# Patient Record
Sex: Female | Born: 1990 | Race: Black or African American | Hispanic: No | Marital: Single | Smoking: Never smoker
Health system: Southern US, Community
[De-identification: ages and names within clinical notes are randomized; demographics above are authoritative.]

---

## 2017-09-04 ENCOUNTER — Other Ambulatory Visit: Payer: Self-pay

## 2017-09-04 ENCOUNTER — Emergency Department (HOSPITAL_BASED_OUTPATIENT_CLINIC_OR_DEPARTMENT_OTHER): Payer: 59

## 2017-09-04 ENCOUNTER — Encounter (HOSPITAL_BASED_OUTPATIENT_CLINIC_OR_DEPARTMENT_OTHER): Payer: Self-pay | Admitting: *Deleted

## 2017-09-04 ENCOUNTER — Emergency Department (HOSPITAL_BASED_OUTPATIENT_CLINIC_OR_DEPARTMENT_OTHER)
Admission: EM | Admit: 2017-09-04 | Discharge: 2017-09-04 | Disposition: A | Discharge status: Home / Self Care | Payer: 59 | Attending: Emergency Medicine | Admitting: Emergency Medicine

## 2017-09-04 DIAGNOSIS — K529 Noninfective gastroenteritis and colitis, unspecified: Secondary | ICD-10-CM | POA: Insufficient documentation

## 2017-09-04 DIAGNOSIS — R1011 Right upper quadrant pain: Secondary | ICD-10-CM | POA: Diagnosis not present

## 2017-09-04 DIAGNOSIS — R112 Nausea with vomiting, unspecified: Secondary | ICD-10-CM | POA: Diagnosis present

## 2017-09-04 LAB — COMPREHENSIVE METABOLIC PANEL
ALT: 17 U/L (ref 14–54)
AST: 23 U/L (ref 15–41)
Albumin: 4 g/dL (ref 3.5–5.0)
Alkaline Phosphatase: 86 U/L (ref 38–126)
Anion gap: 9 (ref 5–15)
BUN: 18 mg/dL (ref 6–20)
CO2: 23 mmol/L (ref 22–32)
Calcium: 8.6 mg/dL — ABNORMAL LOW (ref 8.9–10.3)
Chloride: 104 mmol/L (ref 101–111)
Creatinine, Ser: 1.03 mg/dL — ABNORMAL HIGH (ref 0.44–1.00)
GFR calc Af Amer: >60 mL/min (ref >60)
GFR calc non Af Amer: >60 mL/min (ref >60)
Glucose, Bld: 110 mg/dL — ABNORMAL HIGH (ref 65–99)
Potassium: 4.1 mmol/L (ref 3.5–5.1)
Sodium: 136 mmol/L (ref 135–145)
Total Bilirubin: 1.2 mg/dL (ref 0.3–1.2)
Total Protein: 7.8 g/dL (ref 6.5–8.1)

## 2017-09-04 LAB — URINALYSIS, ROUTINE W REFLEX MICROSCOPIC
Bilirubin Urine: NEGATIVE
Glucose, UA: NEGATIVE mg/dL
Hgb urine dipstick: NEGATIVE
Ketones, ur: 15 mg/dL — AB
Leukocytes, UA: NEGATIVE
Nitrite: NEGATIVE
Protein, ur: 30 mg/dL — AB
Specific Gravity, Urine: >1.03 — ABNORMAL HIGH (ref 1.005–1.030)
pH: 6 (ref 5.0–8.0)

## 2017-09-04 LAB — URINALYSIS, MICROSCOPIC (REFLEX)

## 2017-09-04 LAB — CBC
HCT: 41.5 % (ref 36.0–46.0)
Hemoglobin: 13.6 g/dL (ref 12.0–15.0)
MCH: 27.8 pg (ref 26.0–34.0)
MCHC: 32.8 g/dL (ref 30.0–36.0)
MCV: 84.9 fL (ref 78.0–100.0)
Platelets: 219 10*3/uL (ref 150–400)
RBC: 4.89 MIL/uL (ref 3.87–5.11)
RDW: 13.7 % (ref 11.5–15.5)
WBC: 6.5 10*3/uL (ref 4.0–10.5)

## 2017-09-04 LAB — LIPASE, BLOOD: Lipase: 27 U/L (ref 11–51)

## 2017-09-04 LAB — PREGNANCY, URINE: Preg Test, Ur: NEGATIVE

## 2017-09-04 MED ORDER — ONDANSETRON HCL 4 MG/2ML IJ SOLN
4.0000 mg | Freq: Once | INTRAMUSCULAR | Status: AC
  Administered 2017-09-04: 4 mg via INTRAVENOUS
  Filled 2017-09-04: qty 2

## 2017-09-04 MED ORDER — ONDANSETRON 4 MG PO TBDP: 4.0000 mg | ORAL_TABLET | Freq: Three times a day (TID) | ORAL | 0 refills | Status: AC | PRN

## 2017-09-04 MED ORDER — ACETAMINOPHEN 500 MG PO TABS
1000.0000 mg | ORAL_TABLET | Freq: Once | ORAL | Status: AC
  Administered 2017-09-04: 1000 mg via ORAL
  Filled 2017-09-04: qty 2

## 2017-09-04 MED ORDER — SODIUM CHLORIDE 0.9 % IV BOLUS (SEPSIS)
1000.0000 mL | Freq: Once | INTRAVENOUS | Status: AC
  Administered 2017-09-04: 1000 mL via INTRAVENOUS

## 2017-09-04 MED FILL — ONDANSETRON ODT 4 MG TABLET: 4 | 6 days supply | Qty: 20 | Fill #0

## 2017-09-04 NOTE — Discharge Instructions (Addendum)
Your blood work and urine test were reassuring today.    I have written you a prescription for nausea medicine called Zofran.  Please take this as needed for nausea and vomiting.    The stomach virus is contagious so please do not share drinks with family members or friends.  Please also stick to a bland diet with avoidance of greasy and spicy foods.  Your ultrasound showed a small mass on the liver (1.5cm.)  I have printed this result in your discharge paperwork.  Please inform your primary care doctor to follow-up on this result.  Return to the emergency department if you have vomiting that will not stop, worsening stomach pain or have any new or worsening symptoms.

## 2017-09-04 NOTE — ED Notes (Signed)
NAD at this time. Pt is stable and going home.  

## 2017-09-04 NOTE — ED Provider Notes (Signed)
[facility] EMERGENCY DEPARTMENT Provider Note   CSN: [id number] Arrival date & time: 09/04/17  1150     History   Chief Complaint Chief Complaint  Patient presents with  . Abdominal Pain    HPI Cheryl Parsons is a 26 y.o. female.  HPI  Cheryl Parsons is a 26 year old female with no significant past medical history who presents to the emergency department for evaluation of nausea, vomiting, chills and abdominal pain.  Patient states that her symptoms abruptly woke her from sleep this morning.  She reports that she also has several family members with similar symptoms.  Went to urgent care where she had a negative flu test, negative strep test and negative urine.  Due to the fact that she had significant right upper quadrant pain on exam she was told to come to the emergency department.  Patient states that she has 6/10 severity sharp right upper quadrant pain which is constant and ongoing since the morning.  Her pain is worsened with sitting up or vomiting.  It does not radiate.  She denies previous abdominal surgeries.  She denies hematemesis, hematochezia, melena, dysuria, urinary frequency, vaginal discharge, pelvic pain, chest pain, shortness of breath.   History reviewed. No pertinent past medical history.  There are no active problems to display for this patient.   History reviewed. No pertinent surgical history.  OB History    No data available       Home Medications    Prior to Admission medications   Not on File    Family History No family history on file.  Social History Social History   Tobacco Use  . Smoking status: Never Smoker  . Smokeless tobacco: Never Used  Substance Use Topics  . Alcohol use: No    Frequency: Never  . Drug use: No     Allergies   Patient has no known allergies.   Review of Systems Review of Systems  Constitutional: Positive for appetite change (low appetite) and chills. Negative for fever.  Eyes:  Negative for visual disturbance.  Respiratory: Negative for shortness of breath.   Cardiovascular: Negative for chest pain.  Gastrointestinal: Positive for abdominal pain, diarrhea, nausea and vomiting. Negative for anal bleeding and blood in stool.  Genitourinary: Negative for difficulty urinating, dysuria, flank pain, frequency and hematuria.  Musculoskeletal: Negative for back pain and gait problem.  Skin: Negative for rash.  Neurological: Negative for headaches.  Psychiatric/Behavioral: Negative for agitation.     Physical Exam Updated Vital Signs BP 121/64 (BP Location: Right Arm)   Pulse (!) 102   Temp (!) 100.9 F (38.3 C)   Resp 20   Ht 5\' 9"  (1.753 m)   Wt 123.4 kg (272 lb)   SpO2 98%   BMI 40.17 kg/m   Physical Exam  Constitutional: She is oriented to person, place, and time. She appears well-developed and well-nourished. No distress.  HENT:  Head: Normocephalic and atraumatic.  Mouth/Throat: Oropharynx is clear and moist. No oropharyngeal exudate.  Eyes: Pupils are equal, round, and reactive to light. Right eye exhibits no discharge. Left eye exhibits no discharge. No scleral icterus.  Cardiovascular: Regular rhythm. Exam reveals no friction rub.  No murmur heard. Tachycardic  Pulmonary/Chest: Effort normal and breath sounds normal. No stridor. No respiratory distress. She has no wheezes. She has no rales.  Abdominal: Normal appearance and bowel sounds are normal. She exhibits no distension and no pulsatile midline mass. There is no CVA tenderness.  Tender to palpation in  the right upper quadrant.  No guarding or rigidity.  No rebound tenderness.  Murphy sign positive.  McBurney's point negative.  Neurological: She is alert and oriented to person, place, and time. Coordination normal.  Skin: Skin is warm and dry. Capillary refill takes less than 2 seconds. She is not diaphoretic.  Psychiatric: She has a normal mood and affect. Her behavior is normal.  Nursing note  and vitals reviewed.    ED Treatments / Results  Labs (all labs ordered are listed, but only abnormal results are displayed) Labs Reviewed  COMPREHENSIVE METABOLIC PANEL - Abnormal; Notable for the following components:      Result Value   Glucose, Bld 110 (*)    Creatinine, Ser 1.03 (*)    Calcium 8.6 (*)    All other components within normal limits  URINALYSIS, ROUTINE W REFLEX MICROSCOPIC - Abnormal; Notable for the following components:   Color, Urine AMBER (*)    Specific Gravity, Urine >1.030 (*)    Ketones, ur 15 (*)    Protein, ur 30 (*)    All other components within normal limits  URINALYSIS, MICROSCOPIC (REFLEX) - Abnormal; Notable for the following components:   Bacteria, UA FEW (*)    Squamous Epithelial / LPF 6-30 (*)    All other components within normal limits  LIPASE, BLOOD  CBC  PREGNANCY, URINE    EKG  EKG Interpretation None       Radiology No results found.  Procedures Procedures (including critical care time)  Medications Ordered in ED Medications  sodium chloride 0.9 % bolus 1,000 mL (not administered)  ondansetron (ZOFRAN) injection 4 mg (not administered)     Initial Impression / Assessment and Plan / ED Course  I have reviewed the triage vital signs and the nursing notes.  Pertinent labs & imaging results that were available during my care of the patient were reviewed by me and considered in my medical decision making (see chart for details).    Patient presents with nausea/vomiting and chills which began abruptly this morning.  She has several family members with similar.  She is afebrile and non-toxic appearing.   Pt had a negative flu test and strep test at urgent care today.  Labs reviewed.  CBC WNL. CMP reveals elevated Creatinine (1.03 and no previous to compare to), suspect this is due to dehydration in the setting of her recent vomiting. Have counseled her to follow up with her primary doctor regarding this result and to drink  plenty of fluids in the meantime. She agrees. Lipase negative. Urine pregnancy negative.  UA does not appear infected.   Given she is tender in RUQ will get Abdominal US to evaluate for cholecystitis/cholelithiasis. Do not suspect appendicitis given McBurney's point negative and no tenderness in the LLQ. Furthermore she does not have a leukocytosis. Discussed this patient with Dr. Juleen China who agrees with plan to get abdominal US for further evaluation.   Abdominal US negative for cholelithiasis and cholecystitis. It shows 1.3cm left liver lobe mass with recommendation for follow-up outpatient MRI.  Have informed patient at bedside and also printed the results on her discharge paperwork and she agrees to follow-up with her primary doctor regarding this result.    Symptoms and history are consistent with viral gastroenteritis. Patient VSS. She is without signs of dehydration. Able to tolerate PO fluids at the bedside. Counseled on BRAT diet. She has no complaints prior to discharge.  Have discussed return precautions and patient agrees and voiced understanding  to the plan.   Final Clinical Impressions(s) / ED Diagnoses   Final diagnoses:  Gastroenteritis    ED Discharge Orders        Ordered    ondansetron (ZOFRAN ODT) 4 MG disintegrating tablet  Every 8 hours PRN     09/04/17 1655       Lawrence MarseillesShrosbree, Romon Devereux J, PA-C 09/04/17 1830    Raeford RazorKohut, Stephen, MD 09/05/17 1630

## 2017-09-04 NOTE — ED Triage Notes (Addendum)
[facility] Fever was 103. They gave her Tylenol. She was told to come here for further testing for right upper quadrant pain that she experienced during examination. She had a negative flu test, negative strep test, negative urine and negative pregnancy test this am

## 2019-06-18 IMAGING — US US ABDOMEN COMPLETE
1 series · 13 of 25 positions shown · non-contrast
Comparison: None.

CLINICAL DATA: Right upper quadrant abdominal pain and tenderness.
Vomiting. Diarrhea. Fatigue. Fever.

EXAM:
ABDOMEN ULTRASOUND COMPLETE

[Series 1: us abdomen complete · 0.27mm/px · 13 of 103 slices shown]
[im 1/103]
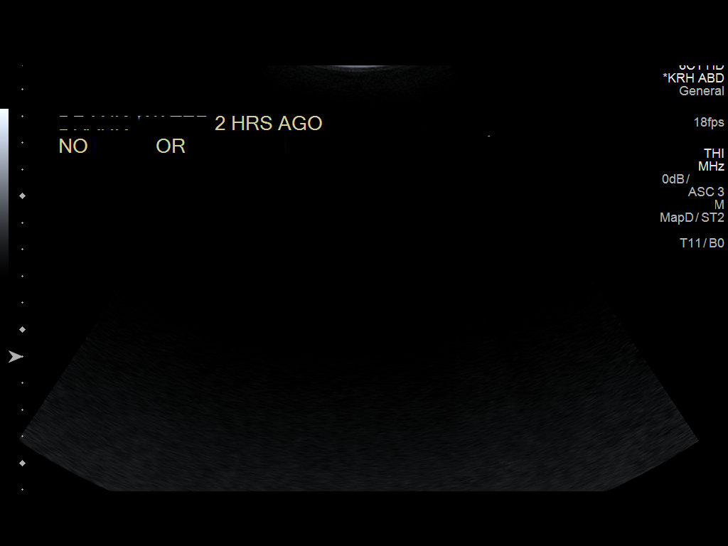
[im 9/103]
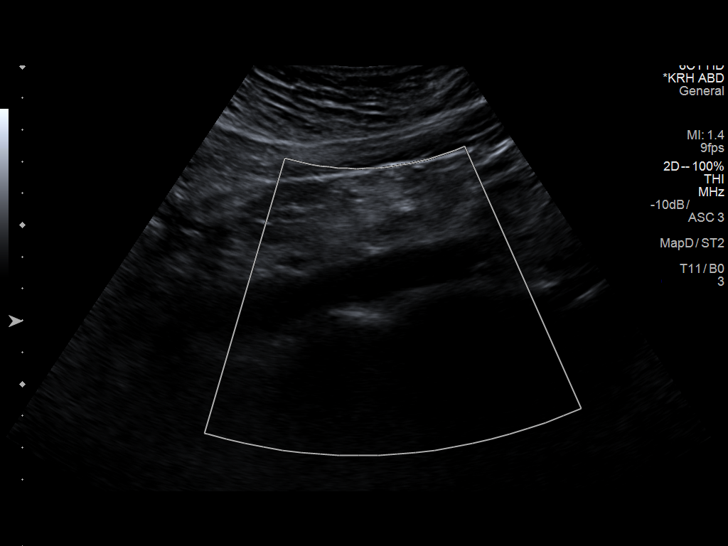
[im 18/103]
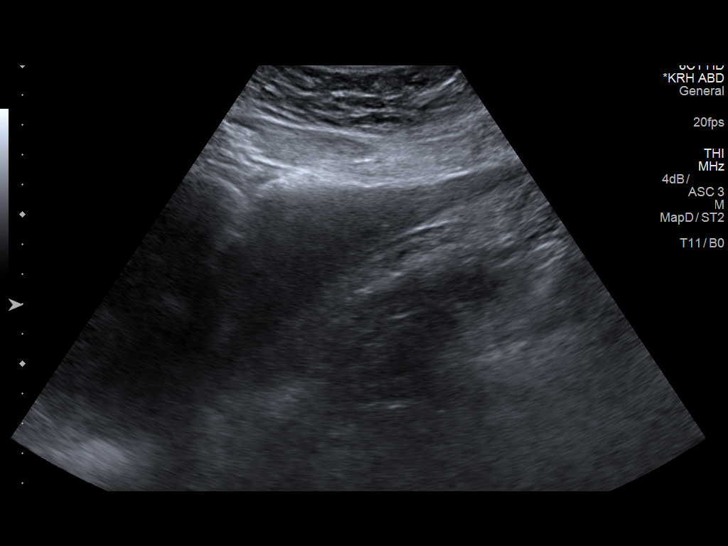
[im 26/103]
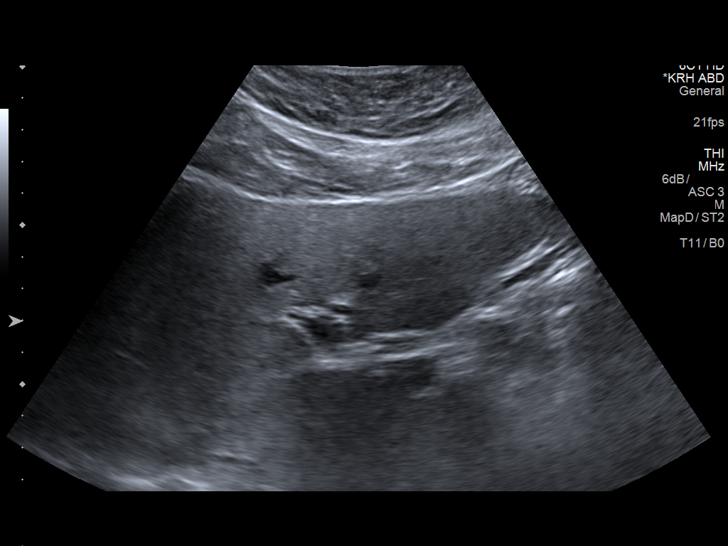
[im 35/103]
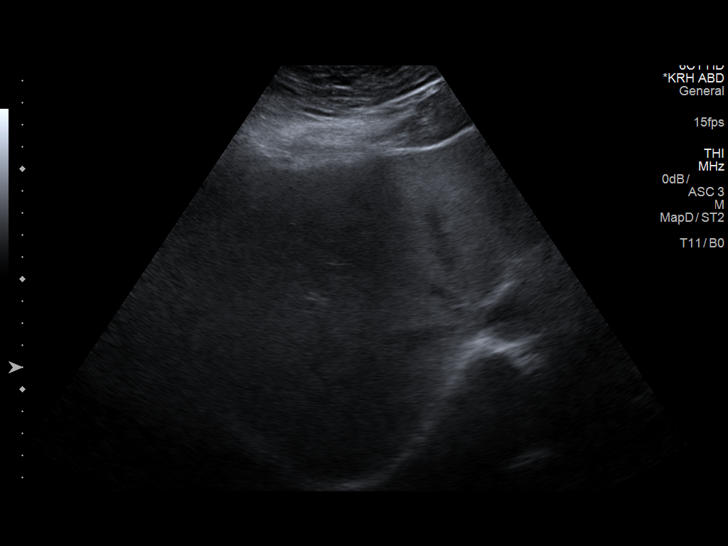
[im 43/103]
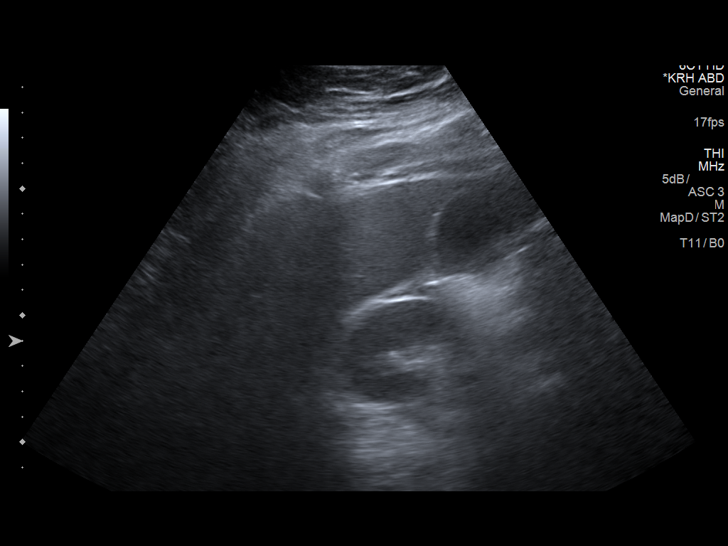
[im 52/103]
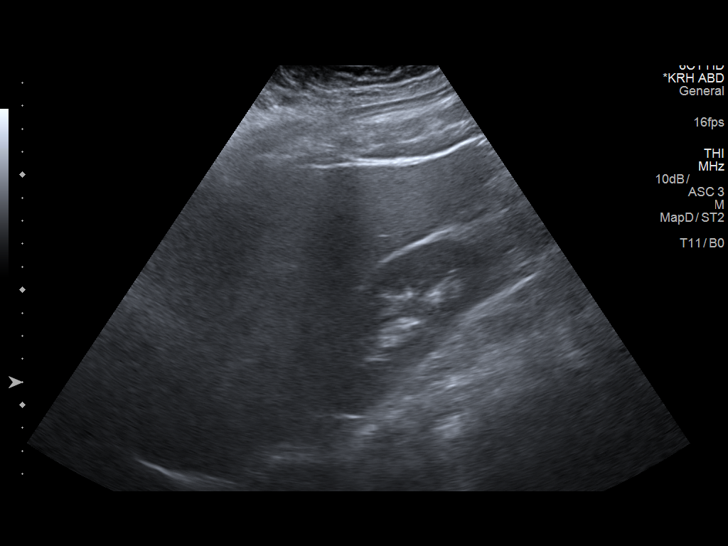
[im 60/103]
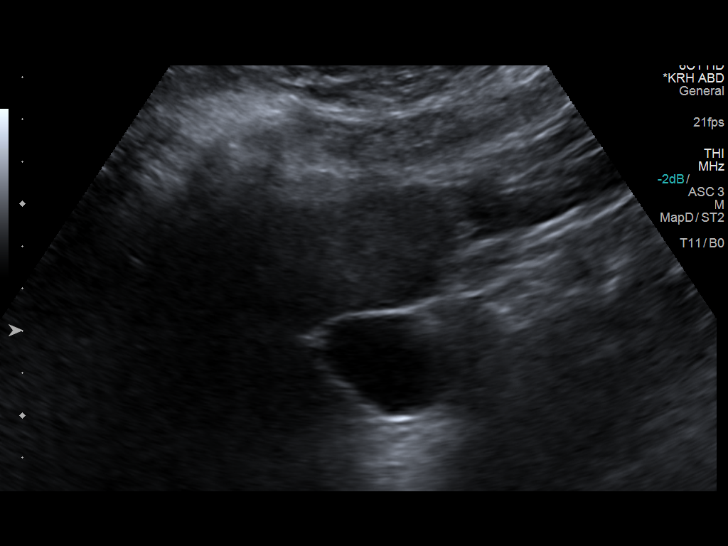
[im 69/103]
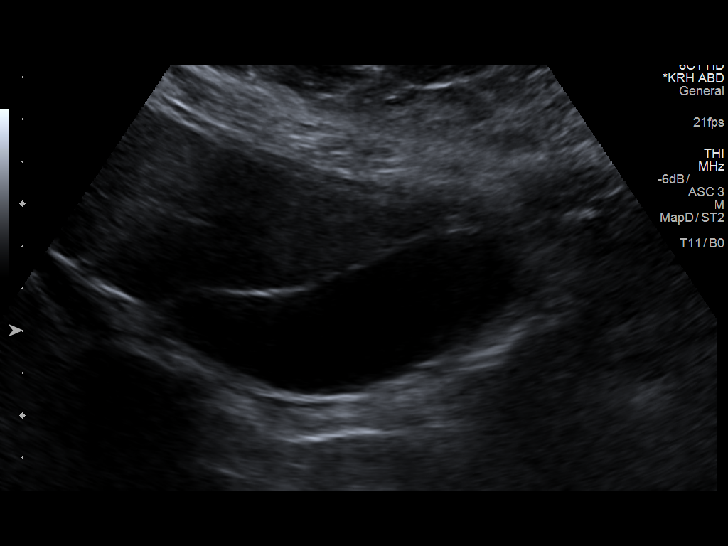
[im 77/103]
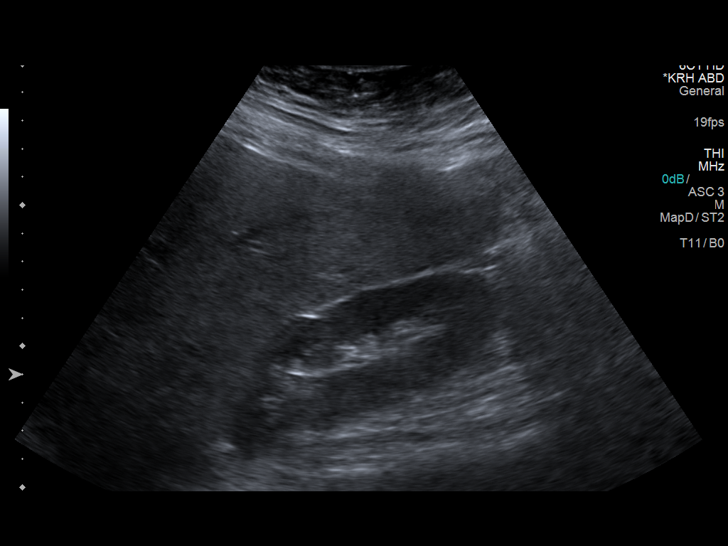
[im 86/103]
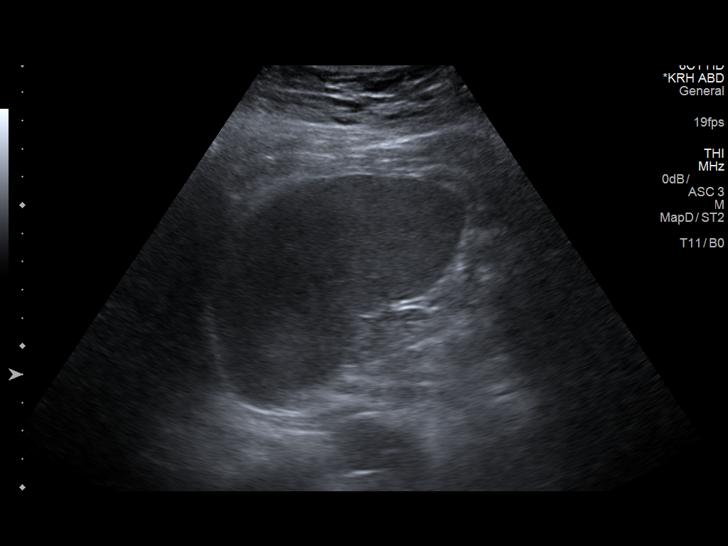
[im 94/103]
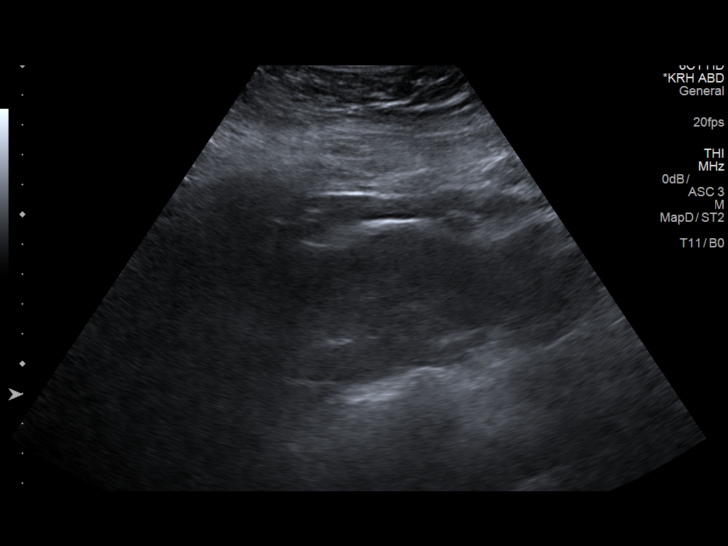
[im 103/103]
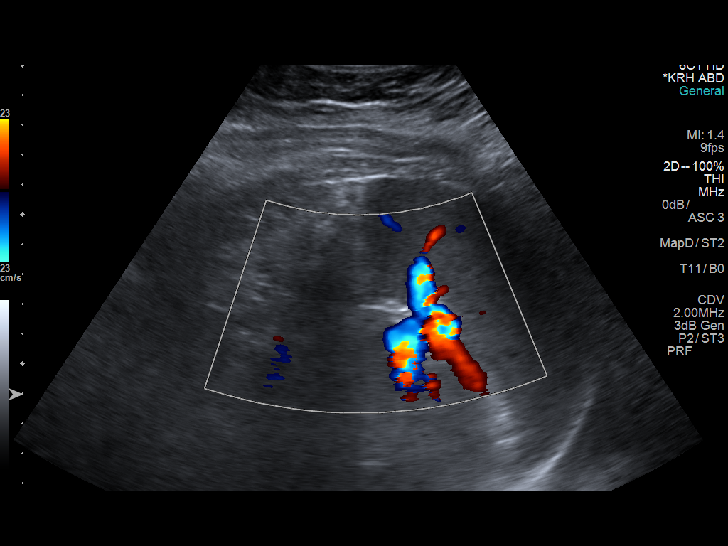

[13 of 25 positions shown; findings below may reference images not displayed]

FINDINGS: Gallbladder: No gallstones or wall thickening visualized. No
sonographic Murphy sign noted by sonographer.

Common bile duct: Diameter: 4 mm

Liver: Hypoechoic 1.1 x 0.9 x 1.3 cm anterior left liver lobe mass.
Liver parenchyma is diffusely mildly echogenic. No definite liver
surface irregularity. No additional liver masses. Portal vein is
patent on color Doppler imaging with normal direction of blood flow
towards the liver.

IVC: No abnormality visualized.

Pancreas: Visualized portion unremarkable.

Spleen: Size and appearance within normal limits.

Right Kidney: Length: 11.5 cm. Echogenicity within normal limits. No
mass or hydronephrosis visualized.

Left Kidney: Length: 10.4 cm. Echogenicity within normal limits. No
mass or hydronephrosis visualized.

Abdominal aorta: No aneurysm visualized.

Other findings: None.
IMPRESSION: 1. Hypoechoic 1.3 cm anterior left liver lobe mass, indeterminate.
Recommend further evaluation with MRI abdomen without and with IV
contrast, preferably performed on a short term outpatient basis.
2. Diffusely mildly echogenic liver parenchyma, suggestive of mild
diffuse hepatic steatosis.
3. Otherwise normal abdominal sonogram.  No cholelithiasis.
